# Patient Record
Sex: Male | Born: 2000 | Race: White | Hispanic: No | Marital: Single | Smoking: Light tobacco smoker
Health system: Southern US, Community
[De-identification: ages and names within clinical notes are randomized; demographics above are authoritative.]

## PROBLEM LIST (undated history)

## (undated) DIAGNOSIS — S060XAA Concussion with loss of consciousness status unknown, initial encounter: Secondary | ICD-10-CM

## (undated) DIAGNOSIS — S060X9A Concussion with loss of consciousness of unspecified duration, initial encounter: Secondary | ICD-10-CM

## (undated) HISTORY — DX: Concussion with loss of consciousness status unknown, initial encounter: S06.0XAA

## (undated) HISTORY — DX: Concussion with loss of consciousness of unspecified duration, initial encounter: S06.0X9A

## (undated) HISTORY — PX: NO PAST SURGERIES: SHX2092

---
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
## 2019-04-17 ENCOUNTER — Ambulatory Visit: Payer: 59 | Attending: Internal Medicine

## 2019-04-17 DIAGNOSIS — Z20822 Contact with and (suspected) exposure to covid-19: Secondary | ICD-10-CM

## 2019-04-18 LAB — NOVEL CORONAVIRUS, NAA: SARS-CoV-2, NAA: NOT DETECTED

## 2019-08-10 ENCOUNTER — Encounter: Payer: Self-pay | Admitting: General Surgery

## 2019-08-10 MED ORDER — OXYCODONE HCL 5 MG PO TABS
5.00 | ORAL_TABLET | ORAL | Status: DC
Start: ? — End: 2019-08-10

## 2019-08-10 MED ORDER — GABAPENTIN 300 MG PO CAPS
300.00 | ORAL_CAPSULE | ORAL | Status: DC
Start: 2019-08-08 — End: 2019-08-10

## 2019-08-10 MED ORDER — OXYCODONE HCL 5 MG PO TABS
10.00 | ORAL_TABLET | ORAL | Status: DC
Start: ? — End: 2019-08-10

## 2019-08-10 MED ORDER — BACITRACIN 500 UNIT/GM EX OINT
1.00 | TOPICAL_OINTMENT | CUTANEOUS | Status: DC
Start: 2019-08-08 — End: 2019-08-10

## 2019-08-10 MED ORDER — CEPHALEXIN 500 MG PO CAPS
500.00 | ORAL_CAPSULE | ORAL | Status: DC
Start: 2019-08-08 — End: 2019-08-10

## 2019-08-10 MED ORDER — ONDANSETRON 4 MG PO TBDP
4.00 | ORAL_TABLET | ORAL | Status: DC
Start: ? — End: 2019-08-10

## 2019-08-10 MED ORDER — DSS 100 MG PO CAPS
100.00 | ORAL_CAPSULE | ORAL | Status: DC
Start: 2019-08-08 — End: 2019-08-10

## 2019-08-10 MED ORDER — BACITRACIN 500 UNIT/GM EX OINT
TOPICAL_OINTMENT | CUTANEOUS | Status: DC
Start: ? — End: 2019-08-10

## 2019-08-10 MED ORDER — ACETAMINOPHEN 325 MG PO TABS
650.00 | ORAL_TABLET | ORAL | Status: DC
Start: 2019-08-08 — End: 2019-08-10

## 2019-08-10 MED ORDER — GENERIC EXTERNAL MEDICATION
6.25 | Status: DC
Start: ? — End: 2019-08-10

## 2019-08-10 MED ORDER — THERA-M PO TABS
1.00 | ORAL_TABLET | ORAL | Status: DC
Start: 2019-08-09 — End: 2019-08-10

## 2019-08-12 ENCOUNTER — Encounter: Payer: Self-pay | Admitting: *Deleted

## 2019-08-12 ENCOUNTER — Encounter: Payer: Self-pay | Admitting: Neurology

## 2019-08-12 ENCOUNTER — Other Ambulatory Visit: Payer: Self-pay

## 2019-08-12 ENCOUNTER — Ambulatory Visit: Payer: 59 | Admitting: Neurology

## 2019-08-12 VITALS — BP 108/72 | HR 73 | Temp 97.2°F | Ht 70.0 in | Wt 136.0 lb

## 2019-08-12 DIAGNOSIS — S060X9A Concussion with loss of consciousness of unspecified duration, initial encounter: Secondary | ICD-10-CM | POA: Diagnosis not present

## 2019-08-12 DIAGNOSIS — S0101XA Laceration without foreign body of scalp, initial encounter: Secondary | ICD-10-CM

## 2019-08-12 DIAGNOSIS — S069X9A Unspecified intracranial injury with loss of consciousness of unspecified duration, initial encounter: Secondary | ICD-10-CM

## 2019-08-12 DIAGNOSIS — S0590XA Unspecified injury of unspecified eye and orbit, initial encounter: Secondary | ICD-10-CM

## 2019-08-12 DIAGNOSIS — T148XXA Other injury of unspecified body region, initial encounter: Secondary | ICD-10-CM | POA: Diagnosis not present

## 2019-08-12 NOTE — Progress Notes (Signed)
Source: notes from Dr. Clelia Croft, referring provider.

## 2019-08-12 NOTE — Patient Instructions (Addendum)
Options include: REST,REST,REST Physical therapy and Occupational Therapy Formal concussion clinic [facility] Discussed with patient at length. Rest is important in concussion recovery. Recommend shortened work/school days, taking the summer off, working from home if she can, taking frequent breaks. No strenuous activity, limiting computer and reading time. heating pad and analgesics for muscular relief Biofeedback - Dr Delsa Grana, PhD - private neurofeedback specialist - IF needed Rest, rest, rest May take upwards of 6 months to recover Dr. Marchelle Gearing ophthalmologist (MD) for eye examination Follow up as needed   Post-Concussion Syndrome  Post-concussion syndrome is when symptoms last longer than normal after a head injury. What are the signs or symptoms? After a head injury, you may:  Have headaches.  Feel tired.  Feel dizzy.  Feel weak.  Have trouble seeing.  Have trouble in bright lights.  Have trouble hearing.  Not be able to remember things.  Not be able to focus.  Have trouble sleeping.  Have mood swings.  Have trouble learning new things. These can last from weeks to months. Follow these instructions at home: Medicines  Take all medicines only as told by your doctor.  Do not take prescription pain medicines. Activity  Limit activities as told by your doctor. This includes: ? Homework. ? Job-related work. ? Thinking. ? Watching TV. ? Using a computer or phone. ? Puzzles. ? Exercise. ? Sports.  Slowly return to your normal activity as told by your doctor.  Stop an activity if you have symptoms.  Do not do anything that may cause you to get injured again. General instructions  Rest. Try to: ? Sleep 7-9 hours each night. ? Take naps or breaks when you feel tired during the day.  Do not drink alcohol until your doctor says that you can.  Keep track of your symptoms.  Keep all follow-up visits as told by your doctor. This is  important. Contact a doctor if:  You do not improve.  You get worse.  You have another injury. Get help right away if:  You have a very bad headache.  You feel confused.  You feel very sleepy.  You pass out (faint).  You throw up (vomit).  You feel weak in any part of your body.  You feel numb in any part of your body.  You start shaking (have a seizure).  You have trouble talking. Summary  Post-concussion syndrome is when symptoms last longer than normal after a head injury.  Limit all activity after your injury. Gradually return to normal activity as told by your doctor.  Rest, do not drink alcohol, and avoid prescription pain medicines after a concussion.  Call your doctor if your symptoms get worse. This information is not intended to replace advice given to you by your health care provider. Make sure you discuss any questions you have with your health care provider. Document Revised: 01/16/2018 Document Reviewed: 04/30/2017 Elsevier Patient Education  2020 [employer].      Returning to [employer] After a Concussion, Teen  A concussion is a brain injury from a direct blow to the head or body that causes the brain to shake quickly back and forth inside the skull. This can damage brain cells and cause chemical changes in the brain. Concussions cause temporary difficulty with certain brain functions involving speech, memory, balance, and coordination. After a concussion, you may have a headache, feel dizzy or nauseated, and have trouble thinking clearly and concentrating. You may also have problems remembering or learning new things.  Concussions can have serious effects on a teen's developing brain. Most concussions heal in 1-2 weeks, although some may take longer. It is important to be patient during your recovery. Trying to do too much can make recovery take longer. When can I return to school? Your health care provider will tell you when it is safe to return to  school. Generally, you can go back to school once your symptoms get better. While you are recovering, your teachers may need to make your workload less demanding. Work with Scientist, physiological, school nurse, and health care provider to figure out which classes may cause your symptoms to get worse. For example, classes with loud noises, such as band or shop class, may need to be avoided until you get better. Tell your teachers, your parents, or your health care provider if your symptoms get worse. As your symptoms improve, you can gradually return to your normal workload and school routine. When can I return to physical education (PE)? You should not do PE class, physical activity at recess, dance, or sports until your health care provider says you can. Once you are back in a normal school routine, you no longer have any symptoms, and you are not taking any medicines for your concussion, then you can start the gradual process of returning to PE, sports, and other activities. What are some ways I can cope with recovery? Rest is an important part of recovering from a concussion. This includes resting your body and your brain. It is important to cut back on the amount of time you spend doing activities that require your brain to problem-solve (cognitive exertion). Your health care provider may recommend cognitive rest, which means avoiding activities such as:  Social media.  Texting.  Reading or writing.  Studying or doing homework.  Using a computer or phone.  Watching television. It can be hard to take a break from these activities, but your recovery will be faster if you allow your brain the time it needs to rest. Gradually return to your usual workload as your symptoms improve. You may need to take breaks whenever you feel very tired or have a headache. How can I get support with my recovery? Talk to your parents, your health care provider, or a school counselor. It is normal to feel angry, sad,  frustrated, or nervous. As you heal, these symptoms should start to go away. Letting people know how you feel helps you get the support you need. For more support, talk to:  Support groups.  Adults at school, including the school nurse, your teachers, and the school counselor. They can work with your parents, your health care provider, and an educational specialist to make sure your school work and schedule are adjusted to your needs during recovery. What symptoms are important to report to my health care provider? Concussion symptoms may not appear right away. They could also get worse at any time. It is important to let your health care provider know if you have any new or worsening symptoms, such as:  Drowsiness or fatigue.  Headache.  Memory loss.  Confusion.  Difficulty concentrating.  Loss of consciousness.  Problems with balance and coordination.  Nausea and vomiting.  Weakness or numbness.  Slurred speech.  Seizures.  Trouble recognizing faces or places.  Inability to remember events before or after the injury.  Irritability.  Changes in sleep habits.  Changes in personality. Some health issues may make your recovery from a concussion take longer. Let your health  care provider know if you have a developmental disorder, such as ADHD, a history of migraines, or a mental health disorder. What questions should I ask my health care provider? When you have a concussion, learning as much as you can about your injury can help to protect your long-term health. Ask your health care provider the following questions:  When can I return to my normal school routine and other activities?  Should I limit how much time I watch TV, play video games, or use a computer?  Do I need more sleep than normal?  Do I need medicine for a concussion?  What are the potential long-term effects of my injury?  Could I have problems with memory or learning?  Should I consider not playing  sports anymore?  What should I tell my teachers or coaches about my injury?  What should I do if my symptoms do not improve or get worse?  What happens if I get another concussion?  What are the warning signs of a concussion?  Could I have a concussion without knowing it?  How can I prevent another concussion? This information is not intended to replace advice given to you by your health care provider. Make sure you discuss any questions you have with your health care provider. Document Revised: 03/08/2017 Document Reviewed: 04/10/2015 Elsevier Patient Education  2020 [employer].
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
